# Patient Record
Sex: Male | Born: 2000 | Race: Black or African American | Hispanic: No | Marital: Single | Smoking: Never smoker
Health system: Southern US, Community
[De-identification: ages and names within clinical notes are randomized; demographics above are authoritative.]

## PROBLEM LIST (undated history)

## (undated) HISTORY — PX: TONSILLECTOMY: SUR1361

## (undated) HISTORY — PX: HERNIA REPAIR: SHX51

---
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
## 2019-06-18 ENCOUNTER — Other Ambulatory Visit: Payer: Self-pay

## 2019-06-18 DIAGNOSIS — Z20822 Contact with and (suspected) exposure to covid-19: Secondary | ICD-10-CM

## 2019-06-19 LAB — NOVEL CORONAVIRUS, NAA: SARS-CoV-2, NAA: DETECTED — AB

## 2019-06-29 DIAGNOSIS — Z20828 Contact with and (suspected) exposure to other viral communicable diseases: Secondary | ICD-10-CM | POA: Diagnosis not present

## 2019-07-05 ENCOUNTER — Other Ambulatory Visit: Payer: Self-pay

## 2019-07-05 DIAGNOSIS — Z20828 Contact with and (suspected) exposure to other viral communicable diseases: Secondary | ICD-10-CM | POA: Diagnosis not present

## 2019-07-05 DIAGNOSIS — Z20822 Contact with and (suspected) exposure to covid-19: Secondary | ICD-10-CM

## 2019-07-07 LAB — NOVEL CORONAVIRUS, NAA: SARS-CoV-2, NAA: NOT DETECTED

## 2019-07-29 ENCOUNTER — Other Ambulatory Visit: Payer: Self-pay

## 2019-07-29 DIAGNOSIS — Z20822 Contact with and (suspected) exposure to covid-19: Secondary | ICD-10-CM

## 2019-07-31 LAB — NOVEL CORONAVIRUS, NAA: SARS-CoV-2, NAA: NOT DETECTED

## 2019-08-07 ENCOUNTER — Emergency Department (HOSPITAL_COMMUNITY): Payer: Medicaid Other

## 2019-08-07 ENCOUNTER — Other Ambulatory Visit: Payer: Self-pay

## 2019-08-07 ENCOUNTER — Emergency Department (HOSPITAL_COMMUNITY)
Admission: EM | Admit: 2019-08-07 | Discharge: 2019-08-07 | Disposition: A | Discharge status: Home / Self Care | Payer: Medicaid Other | Attending: Emergency Medicine | Admitting: Emergency Medicine

## 2019-08-07 ENCOUNTER — Encounter (HOSPITAL_COMMUNITY): Payer: Self-pay | Admitting: Emergency Medicine

## 2019-08-07 DIAGNOSIS — M7989 Other specified soft tissue disorders: Secondary | ICD-10-CM | POA: Diagnosis not present

## 2019-08-07 DIAGNOSIS — Z79899 Other long term (current) drug therapy: Secondary | ICD-10-CM | POA: Insufficient documentation

## 2019-08-07 DIAGNOSIS — S99922A Unspecified injury of left foot, initial encounter: Secondary | ICD-10-CM | POA: Diagnosis not present

## 2019-08-07 DIAGNOSIS — M79672 Pain in left foot: Secondary | ICD-10-CM | POA: Insufficient documentation

## 2019-08-07 NOTE — ED Provider Notes (Signed)
[facility] DEPT Provider Note   CSN: [id number] Arrival date & time: 08/07/19  1948     History   Chief Complaint Chief Complaint  Patient presents with  . Foot Pain    HPI Luis May is a 18 y.o. male presents today for evaluation of acute onset, persistent left foot pain for 3 days.  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  He struck his left great toe and dorsum of the left foot on the cement step and has had throbbing pain since then.  Notes some mild swelling and ecchymosis to the dorsum of the left foot.  Denies numbness or tingling.  Pain worsens with attempts to weight-bear but he is able to do so without difficulty.  He took an over-the-counter pain reliever with little relief.  Denies fevers.     The history is provided by the patient.  Foot Pain    History reviewed. No pertinent past medical history.  There are no active problems to display for this patient.   Past Surgical History:  Procedure Laterality Date  . HERNIA REPAIR    . TONSILLECTOMY          Home Medications    Prior to Admission medications   Medication Sig Start Date End Date Taking? Authorizing Provider  cephALEXin (KEFLEX) 500 MG capsule Take 1 capsule (500 mg total) by mouth 3 (three) times daily. 06/18/15   Kirichenko, Tatyana, PA-C  naproxen (NAPROSYN) 500 MG tablet Take 1 tablet (500 mg total) by mouth 2 (two) times daily. 06/18/15   Jeannett Senior, PA-C    Family History No family history on file.  Social History Social History   Tobacco Use  . Smoking status: Not on file  Substance Use Topics  . Alcohol use: Not on file  . Drug use: Not on file     Allergies   Patient has no known allergies.   Review of Systems Review of Systems  Constitutional: Negative for chills and fever.  Musculoskeletal: Positive for arthralgias.  Neurological: Negative for weakness  and numbness.     Physical Exam Updated Vital Signs BP 128/85   Pulse 73   Temp 98.6 F (37 C)   Resp 14   Ht 5\' 10"  (1.778 m)   Wt 72.6 kg   SpO2 100%   BMI 22.96 kg/m   Physical Exam Vitals signs and nursing note reviewed.  Constitutional:      General: He is not in acute distress.    Appearance: He is well-developed.  HENT:     Head: Normocephalic and atraumatic.  Eyes:     General:        Right eye: No discharge.        Left eye: No discharge.     Conjunctiva/sclera: Conjunctivae normal.  Neck:     Vascular: No JVD.     Trachea: No tracheal deviation.  Cardiovascular:     Rate and Rhythm: Normal rate.     Pulses: Normal pulses.     Comments: 2+ DP/PT pulses bilaterally, compartments are soft Pulmonary:     Effort: Pulmonary effort is normal.  Abdominal:     General: There is no distension.  Musculoskeletal:        General: Tenderness present.     Comments: Mild ecchymosis to the dorsum of the left foot, tenderness to palpation overlying the left first and second metatarsals,  no crepitus or deformity.  5/5 strength of BLE major muscle groups including with plantarflexion, dorsiflexion, inversion, and eversion of the left ankle and EHL strength.  Examination of Achilles tendon is within normal limits.  Skin:    General: Skin is warm and dry.     Capillary Refill: Capillary refill takes less than 2 seconds.     Findings: No erythema.  Neurological:     Mental Status: He is alert.     Comments: Ambulatory with steady gait and balance, able to heel walk and toe walk without difficulty.  Sensation intact to light touch of bilateral lower extremities  Psychiatric:        Behavior: Behavior normal.      ED Treatments / Results  Labs (all labs ordered are listed, but only abnormal results are displayed) Labs Reviewed - No data to display  EKG None  Radiology Dg Foot Complete Left  Result Date: 08/07/2019 CLINICAL DATA:  Stubbing toe injury of the great  toe 3 days ago. Pain and swelling. EXAM: LEFT FOOT - COMPLETE 3+ VIEW COMPARISON:  Left ankle radiographs from 01/10/2010 FINDINGS: There is no evidence of fracture or dislocation. There is no evidence of arthropathy or other focal bone abnormality. Soft tissues are unremarkable. IMPRESSION: Negative. Electronically Signed   By: Van Clines M.D.   On: 08/07/2019 20:15    Procedures Procedures (including critical care time)  Medications Ordered in ED Medications - No data to display   Initial Impression / Assessment and Plan / ED Course  I have reviewed the triage vital signs and the nursing notes.  Pertinent labs & imaging results that were available during my care of the patient were reviewed by me and considered in my medical decision making (see chart for details).        Patient presenting for evaluation of left foot pain secondary to injury 3 days ago.  He is afebrile, vital signs are stable.  He is nontoxic in appearance.  He is neurovascularly intact, ambulatory without difficulty in the ED.  Compartments are soft.  Radiographs are negative for acute osseous abnormality.  No signs of secondary skin infection.  Conservative therapy and management indicated and discussed with patient.  Encouraged follow-up with PCP or orthopedist if symptoms persist, also discussed possibility of missed fracture diagnosis.  Discussed strict ED return precautions.  Patient and significant other verbalized understanding of and agreement with plan and patient is safe for discharge home at this time.   Final Clinical Impressions(s) / ED Diagnoses   Final diagnoses:  Acute foot pain, left    ED Discharge Orders    None       Renita Papa, PA-C 08/07/19 2200    Lucrezia Starch, MD 08/08/19 1513

## 2019-08-07 NOTE — Discharge Instructions (Signed)
1. Medications: Alternate 600 mg of ibuprofen and [phone] mg of Tylenol every 3 hours as needed for pain. Do not exceed 4000 mg of Tylenol daily.  Take ibuprofen with food to avoid upset stomach issues.  2. Treatment: rest, ice, elevate and use brace, drink plenty of fluids, gentle stretching 3. Follow Up: Please followup with orthopedics as directed or your PCP in 1 week if no improvement for discussion of your diagnoses and further evaluation after today's visit; if you do not have a primary care doctor use the resource guide provided to find one; Please return to the ER for worsening symptoms or other concerns such as worsening swelling, redness of the skin, fevers, loss of pulses, or loss of feeling

## 2019-08-07 NOTE — ED Notes (Signed)
Pt refused discharge vital signs

## 2019-08-07 NOTE — ED Triage Notes (Signed)
Patient reports hitting left great toe on cement step x3 days ago. Reports pain to foot with swelling. Ambulatory.

## 2019-08-12 ENCOUNTER — Other Ambulatory Visit: Payer: Self-pay

## 2019-08-12 DIAGNOSIS — Z20828 Contact with and (suspected) exposure to other viral communicable diseases: Secondary | ICD-10-CM | POA: Diagnosis not present

## 2019-08-12 DIAGNOSIS — Z20822 Contact with and (suspected) exposure to covid-19: Secondary | ICD-10-CM

## 2019-08-13 LAB — NOVEL CORONAVIRUS, NAA: SARS-CoV-2, NAA: NOT DETECTED

## 2019-09-30 DIAGNOSIS — Z00129 Encounter for routine child health examination without abnormal findings: Secondary | ICD-10-CM | POA: Diagnosis not present

## 2019-09-30 DIAGNOSIS — Z68.41 Body mass index (BMI) pediatric, 5th percentile to less than 85th percentile for age: Secondary | ICD-10-CM | POA: Diagnosis not present

## 2019-09-30 DIAGNOSIS — Z713 Dietary counseling and surveillance: Secondary | ICD-10-CM | POA: Diagnosis not present

## 2019-09-30 DIAGNOSIS — Z0000 Encounter for general adult medical examination without abnormal findings: Secondary | ICD-10-CM | POA: Diagnosis not present

## 2019-09-30 DIAGNOSIS — Z7182 Exercise counseling: Secondary | ICD-10-CM | POA: Diagnosis not present

## 2020-02-27 ENCOUNTER — Other Ambulatory Visit: Payer: Self-pay

## 2020-02-27 ENCOUNTER — Ambulatory Visit (INDEPENDENT_AMBULATORY_CARE_PROVIDER_SITE_OTHER): Payer: Medicaid Other | Admitting: Dermatology

## 2020-02-27 DIAGNOSIS — L700 Acne vulgaris: Secondary | ICD-10-CM

## 2020-02-27 NOTE — Progress Notes (Signed)
   Follow-Up Visit   Subjective  Luis May is a 19 y.o. male who presents for the following: Acne.  Patient here today for 3 month acne follow up. He is using BenzaClin in the am and adapalene 0.3% gel at bedtime. Acne is improving per patient. Patient is not having any issues on the topical medications.   The following portions of the chart were reviewed this encounter and updated as appropriate:  Allergies  Meds  Problems  Med Hx  Surg Hx  Fam Hx      Review of Systems:  No other skin or systemic complaints except as noted in HPI or Assessment and Plan.  Objective  Well appearing patient in no apparent distress; mood and affect are within normal limits.  A focused examination was performed including face. Relevant physical exam findings are noted in the Assessment and Plan.  Objective  face: Mild to moderate open non-inflamed comedones on temples and forehead   Assessment & Plan    Acne vulgaris face  Cont adapalene 0.3% gel increasing to QHS to temples and forehead. May use every other night to rest of the face. Cont BenzaClin every other morning  Return in about 6 months (around 08/28/2020) for Acne.  Graciella Belton, RMA, am acting as scribe for Sarina Ser, MD .  Documentation: I have reviewed the above documentation for accuracy and completeness, and I agree with the above.  Sarina Ser, MD

## 2020-02-27 NOTE — Patient Instructions (Addendum)
Continue adapalene 0.3% gel increasing to every night to temples and forehead. May use every other night to rest of the face. Continue BenzaClin every other morning.

## 2020-03-02 ENCOUNTER — Encounter: Payer: Self-pay | Admitting: Dermatology

## 2020-07-02 ENCOUNTER — Other Ambulatory Visit: Payer: Self-pay

## 2020-07-02 ENCOUNTER — Ambulatory Visit (INDEPENDENT_AMBULATORY_CARE_PROVIDER_SITE_OTHER): Payer: 59 | Admitting: Dermatology

## 2020-07-02 DIAGNOSIS — L910 Hypertrophic scar: Secondary | ICD-10-CM | POA: Diagnosis not present

## 2020-07-02 DIAGNOSIS — D485 Neoplasm of uncertain behavior of skin: Secondary | ICD-10-CM | POA: Diagnosis not present

## 2020-07-02 DIAGNOSIS — L700 Acne vulgaris: Secondary | ICD-10-CM

## 2020-07-02 MED ORDER — CLINDAMYCIN PHOS-BENZOYL PEROX 1-5 % EX GEL: CUTANEOUS | 6 refills | Status: DC

## 2020-07-02 MED ORDER — ADAPALENE 0.3 % EX GEL: CUTANEOUS | 6 refills | Status: DC

## 2020-07-02 NOTE — Patient Instructions (Addendum)
Topical retinoid medications like tretinoin/Retin-A, adapalene/Differin, tazarotene/Fabior, and Epiduo/Epiduo Forte can cause dryness and irritation when first started. Only apply a pea-sized amount to the entire affected area. Avoid applying it around the eyes, edges of mouth and creases at the nose. If you experience irritation, use a good moisturizer first and/or apply the medicine less often. If you are doing well with the medicine, you can increase how often you use it until you are applying every night. Be careful with sun protection while using this medication as it can make you sensitive to the sun. This medicine should not be used by pregnant women.   Wound Care Instructions  1. Cleanse wound gently with soap and water once a day then pat dry with clean gauze. Apply a thing coat of Petrolatum (petroleum jelly, "Vaseline") over the wound (unless you have an allergy to this). We recommend that you use a new, sterile tube of Vaseline. Do not pick or remove scabs. Do not remove the yellow or white "healing tissue" from the base of the wound.  2. Cover the wound with fresh, clean, nonstick gauze and secure with paper tape. You may use Band-Aids in place of gauze and tape if the would is small enough, but would recommend trimming much of the tape off as there is often too much. Sometimes Band-Aids can irritate the skin.  3. You should call the office for your biopsy report after 1 week if you have not already been contacted.  4. If you experience any problems, such as abnormal amounts of bleeding, swelling, significant bruising, significant pain, or evidence of infection, please call the office immediately.  5. FOR ADULT SURGERY PATIENTS: If you need something for pain relief you may take 1 extra strength Tylenol (acetaminophen) AND 2 Ibuprofen (200mg  each) together every 4 hours as needed for pain. (do not take these if you are allergic to them or if you have a reason you should not take them.)  Typically, you may only need pain medication for 1 to 3 days.

## 2020-07-02 NOTE — Progress Notes (Signed)
   Follow-Up Visit   Subjective  Luis May is a 19 y.o. male who presents for the following: Acne. Patient is currently using Adapalene gel every other night. He lost the Benzaclin gel while moving. He also has a lesion on the L ear that we had discussed biopsying in the past.  We will do that today.  The following portions of the chart were reviewed this encounter and updated as appropriate:  Allergies  Meds  Problems  Med Hx  Surg Hx  Fam Hx     Review of Systems:  No other skin or systemic complaints except as noted in HPI or Assessment and Plan.  Objective  Well appearing patient in no apparent distress; mood and affect are within normal limits.  A focused examination was performed including the face . Relevant physical exam findings are noted in the Assessment and Plan.  Objective  Face: Moderate fine closed comedones over the face   Objective  Left Ear: Total size measures 0.7 cm - verrucous papule  Biopsy site measures 0.4 cm - verrucous papule      Assessment & Plan  Acne vulgaris Face  Increase Adapalene 0.3% gel to QHS instead of QOD. Restart Benzaclin gel QAM. Topical retinoid medications like tretinoin/Retin-A, adapalene/Differin, tazarotene/Fabior, and Epiduo/Epiduo Forte can cause dryness and irritation when first started. Only apply a pea-sized amount to the entire affected area. Avoid applying it around the eyes, edges of mouth and creases at the nose. If you experience irritation, use a good moisturizer first and/or apply the medicine less often. If you are doing well with the medicine, you can increase how often you use it until you are applying every night. Be careful with sun protection while using this medication as it can make you sensitive to the sun. This medicine should not be used by pregnant women.    clindamycin-benzoyl peroxide (BENZACLIN WITH PUMP) gel - Face  Adapalene (DIFFERIN) 0.3 % gel - Face   Neoplasm of uncertain behavior of  skin -keloid versus seborrheic keratosis versus nevus versus viral wart versus other Left Ear  Skin / nail biopsy Type of biopsy: tangential   Informed consent: discussed and consent obtained   Timeout: patient name, date of birth, surgical site, and procedure verified   Procedure prep:  Patient was prepped and draped in usual sterile fashion Prep type:  Isopropyl alcohol Anesthesia: the lesion was anesthetized in a standard fashion   Anesthetic:  1% lidocaine w/ epinephrine 1-100,000 buffered w/ 8.4% NaHCO3 Instrument used: flexible razor blade   Hemostasis achieved with: pressure, aluminum chloride and electrodesiccation   Outcome: patient tolerated procedure well   Post-procedure details: sterile dressing applied and wound care instructions given   Dressing type: bandage and petrolatum    Specimen 1 - Surgical pathology Differential Diagnosis: D48.5 wart vs keloid vs other  Check Margins: No Total size measures 0.7 cm - verrucous papule  Biopsy site measures 0.4 cm - verrucous papule  Return in about 6 months (around 12/31/2020) for F/U appt. - recheck acne .  Luther Redo, CMA, am acting as scribe for Sarina Ser, MD .  Documentation: I have reviewed the above documentation for accuracy and completeness, and I agree with the above.  Sarina Ser, MD

## 2020-07-03 ENCOUNTER — Encounter: Payer: Self-pay | Admitting: Dermatology

## 2020-07-08 ENCOUNTER — Telehealth: Payer: Self-pay

## 2020-07-08 NOTE — Telephone Encounter (Signed)
Left message on voicemail to return my call.  

## 2020-07-08 NOTE — Telephone Encounter (Signed)
-----   Message from Ralene Bathe, MD sent at 07/07/2020  6:56 PM EDT ----- Skin , left ear VERRUCA VULGARIS, IRRITATED  Benign viral wart Schedule for treatment of remaining spot

## 2020-07-13 NOTE — Telephone Encounter (Signed)
Pt informed of results. Will call back to schedule follow up.

## 2020-11-01 ENCOUNTER — Encounter (HOSPITAL_COMMUNITY): Payer: Self-pay | Admitting: Emergency Medicine

## 2020-11-01 ENCOUNTER — Emergency Department (HOSPITAL_COMMUNITY)
Admission: EM | Admit: 2020-11-01 | Discharge: 2020-11-02 | Disposition: A | Discharge status: Home / Self Care | Payer: Medicaid Other | Attending: Emergency Medicine | Admitting: Emergency Medicine

## 2020-11-01 ENCOUNTER — Other Ambulatory Visit: Payer: Self-pay

## 2020-11-01 DIAGNOSIS — T7840XA Allergy, unspecified, initial encounter: Secondary | ICD-10-CM | POA: Diagnosis not present

## 2020-11-01 MED ORDER — DIPHENHYDRAMINE HCL 50 MG/ML IJ SOLN
25.0000 mg | Freq: Once | INTRAMUSCULAR | Status: AC
  Administered 2020-11-01: 25 mg via INTRAVENOUS
  Filled 2020-11-01: qty 1

## 2020-11-01 MED ORDER — METHYLPREDNISOLONE SODIUM SUCC 125 MG IJ SOLR
125.0000 mg | Freq: Once | INTRAMUSCULAR | Status: AC
  Administered 2020-11-01: 125 mg via INTRAVENOUS
  Filled 2020-11-01: qty 2

## 2020-11-01 MED ORDER — FAMOTIDINE IN NACL 20-0.9 MG/50ML-% IV SOLN
20.0000 mg | Freq: Once | INTRAVENOUS | Status: AC
  Administered 2020-11-01: 20 mg via INTRAVENOUS
  Filled 2020-11-01: qty 50

## 2020-11-01 NOTE — ED Provider Notes (Signed)
[facility] DEPT Provider Note   CSN: [id number] Arrival date & time: 11/01/20  2258     History Chief Complaint  Patient presents with  . Allergic Reaction    NIV Luis May is a 20 y.o. male.  The history is provided by the patient.  Allergic Reaction Presenting symptoms: no rash and no wheezing   Severity:  Moderate Prior allergic episodes:  Food/nut allergies Context: food   Context comment:  Strawberries  Relieved by:  Nothing Worsened by:  Nothing Ineffective treatments:  None tried Knew he was allergic to strawberries but ate candy with label in another language and ate it.  No swelling but felt like lips might swell.      History reviewed. No pertinent past medical history.  There are no problems to display for this patient.   Past Surgical History:  Procedure Laterality Date  . HERNIA REPAIR    . TONSILLECTOMY         History reviewed. No pertinent family history.  Social History   Tobacco Use  . Smoking status: Never Smoker  . Smokeless tobacco: Never Used  Substance Use Topics  . Alcohol use: Never  . Drug use: Never    Home Medications Prior to Admission medications   Medication Sig Start Date End Date Taking? Authorizing Provider  Adapalene (DIFFERIN) 0.3 % gel Apply a pea sized amount to the entire face QHS. 07/02/20   Ralene Bathe, MD  Adapalene 0.3 % gel APPLY A SMALL AMOUNT TO AFFECTED AREA EVERY NIGHT [place] OFF IN AM Patient not taking: Reported on 07/02/2020 12/29/19   [provider]  cephALEXin (KEFLEX) 500 MG capsule Take 1 capsule (500 mg total) by mouth 3 (three) times daily. Patient not taking: Reported on 02/27/2020 06/18/15   Jeannett Senior, PA-C  clindamycin-benzoyl peroxide (BENZACLIN WITH PUMP) gel Apply a thin coat to the entire face QAM. 07/02/20   Ralene Bathe, MD  clindamycin-benzoyl peroxide [facility]) gel SMARTSIG:1 [place] Topical Every Morning Patient not  taking: Reported on 07/02/2020 11/27/19   [provider]  naproxen (NAPROSYN) 500 MG tablet Take 1 tablet (500 mg total) by mouth 2 (two) times daily. Patient not taking: Reported on 02/27/2020 06/18/15   Jeannett Senior, PA-C    Allergies    Strawberry (diagnostic)  Review of Systems   Review of Systems  Constitutional: Negative for fever.  HENT: Negative for congestion.   Eyes: Negative for visual disturbance.  Respiratory: Negative for wheezing.   Gastrointestinal: Negative for abdominal pain.  Genitourinary: Negative for difficulty urinating.  Musculoskeletal: Negative for arthralgias.  Skin: Negative for rash.  Neurological: Negative for dizziness.  Psychiatric/Behavioral: Negative for agitation.  All other systems reviewed and are negative.   Physical Exam Updated Vital Signs BP 140/88 (BP Location: Right Arm)   Pulse 78   Temp 98.4 F (36.9 C) (Oral)   Resp 18   Ht 5\' 11"  (1.803 m)   Wt 72.6 kg   SpO2 98%   BMI 22.32 kg/m   Physical Exam Vitals and nursing note reviewed.  Constitutional:      Appearance: Normal appearance. He is not diaphoretic.  HENT:     Head: Normocephalic and atraumatic.     Nose: Nose normal.     Mouth/Throat:     Mouth: Mucous membranes are moist.     Pharynx: Oropharynx is clear.     Comments: No swelling of the lips tongue or uvula  Eyes:     Pupils:  Pupils are equal, round, and reactive to light.  Cardiovascular:     Rate and Rhythm: Normal rate and regular rhythm.     Pulses: Normal pulses.     Heart sounds: Normal heart sounds.  Pulmonary:     Effort: Pulmonary effort is normal.     Breath sounds: Normal breath sounds.  Abdominal:     General: Abdomen is flat. Bowel sounds are normal.     Palpations: Abdomen is soft.     Tenderness: There is no abdominal tenderness. There is no guarding.  Musculoskeletal:        General: Normal range of motion.     Cervical back: Normal range of motion and neck supple.   Skin:    General: Skin is warm and dry.     Capillary Refill: Capillary refill takes less than 2 seconds.     Findings: No rash.  Neurological:     General: No focal deficit present.     Mental Status: He is alert and oriented to person, place, and time.     Deep Tendon Reflexes: Reflexes normal.  Psychiatric:        Mood and Affect: Mood normal.        Behavior: Behavior normal.     ED Results / Procedures / Treatments   Labs (all labs ordered are listed, but only abnormal results are displayed) Labs Reviewed - No data to display  EKG None  Radiology No results found.  Procedures Procedures   Medications Ordered in ED Medications  methylPREDNISolone sodium succinate (SOLU-MEDROL) 125 mg/2 mL injection 125 mg (has no administration in time range)  famotidine (PEPCID) IVPB 20 mg premix (has no administration in time range)  diphenhydrAMINE (BENADRYL) injection 25 mg (25 mg Intravenous Given 11/01/20 2319)    ED Course  I have reviewed the triage vital signs and the nursing notes.  Pertinent labs & imaging results that were available during my care of the patient were reviewed by me and considered in my medical decision making (see chart for details).   no signs of aResting comfortably post meds.  Do not ingest strawberries in any form.  Stable for discharge with close follow up.   DEMONTRAY FRANTA was evaluated in Emergency Department on 11/01/2020 for the symptoms described in the history of present illness. He was evaluated in the context of the global COVID-19 pandemic, which necessitated consideration that the patient might be at risk for infection with the SARS-CoV-2 virus that causes COVID-19. Institutional protocols and algorithms that pertain to the evaluation of patients at risk for COVID-19 are in a state of rapid change based on information released by regulatory bodies including the CDC and federal and state organizations. These policies and algorithms were  followed during the patient's care in the ED.  Final Clinical Impression(s) / ED Diagnoses Return for intractable cough, coughing up blood, fevers >100.4 unrelieved by medication, shortness of breath, intractable vomiting, chest pain, shortness of breath, weakness, numbness, changes in speech, facial asymmetry, abdominal pain, passing out, Inability to tolerate liquids or food, cough, altered mental status or any concerns. No signs of systemic illness or infection. The patient is nontoxic-appearing on exam and vital signs are within normal limits.  I have reviewed the triage vital signs and the nursing notes. Pertinent labs & imaging results that were available during my care of the patient were reviewed by me and considered in my medical decision making (see chart for details). After history, exam, and medical workup I  feel the patient has been appropriately medically screened and is safe for discharge home. Pertinent diagnoses were discussed with the patient. Patient was given return precautions.    Ellinor Test, MD 11/02/20 4799

## 2020-11-01 NOTE — ED Triage Notes (Signed)
Pt sts allergy to strawberries. Ate strawberries 1 hr prior to arrival. Lip swelling.

## 2020-11-02 MED ORDER — PREDNISONE 20 MG PO TABS: ORAL_TABLET | ORAL | 0 refills | Status: AC

## 2020-12-17 ENCOUNTER — Ambulatory Visit: Payer: Medicaid Other | Admitting: Family Medicine

## 2021-01-05 DIAGNOSIS — U071 COVID-19: Secondary | ICD-10-CM | POA: Diagnosis not present

## 2021-01-14 ENCOUNTER — Ambulatory Visit: Payer: 59 | Admitting: Dermatology

## 2021-05-27 ENCOUNTER — Ambulatory Visit: Payer: Self-pay | Admitting: Dermatology

## 2021-05-27 ENCOUNTER — Other Ambulatory Visit: Payer: Self-pay

## 2021-06-20 IMAGING — CR DG FOOT COMPLETE 3+V*L*
3 series · 3 of 3 positions shown · non-contrast
Comparison: Left ankle radiographs from 01/10/2010

CLINICAL DATA: Stubbing toe injury of the great toe 3 days ago.
Pain and swelling.

EXAM:
LEFT FOOT - COMPLETE 3+ VIEW

[x foot ap left]
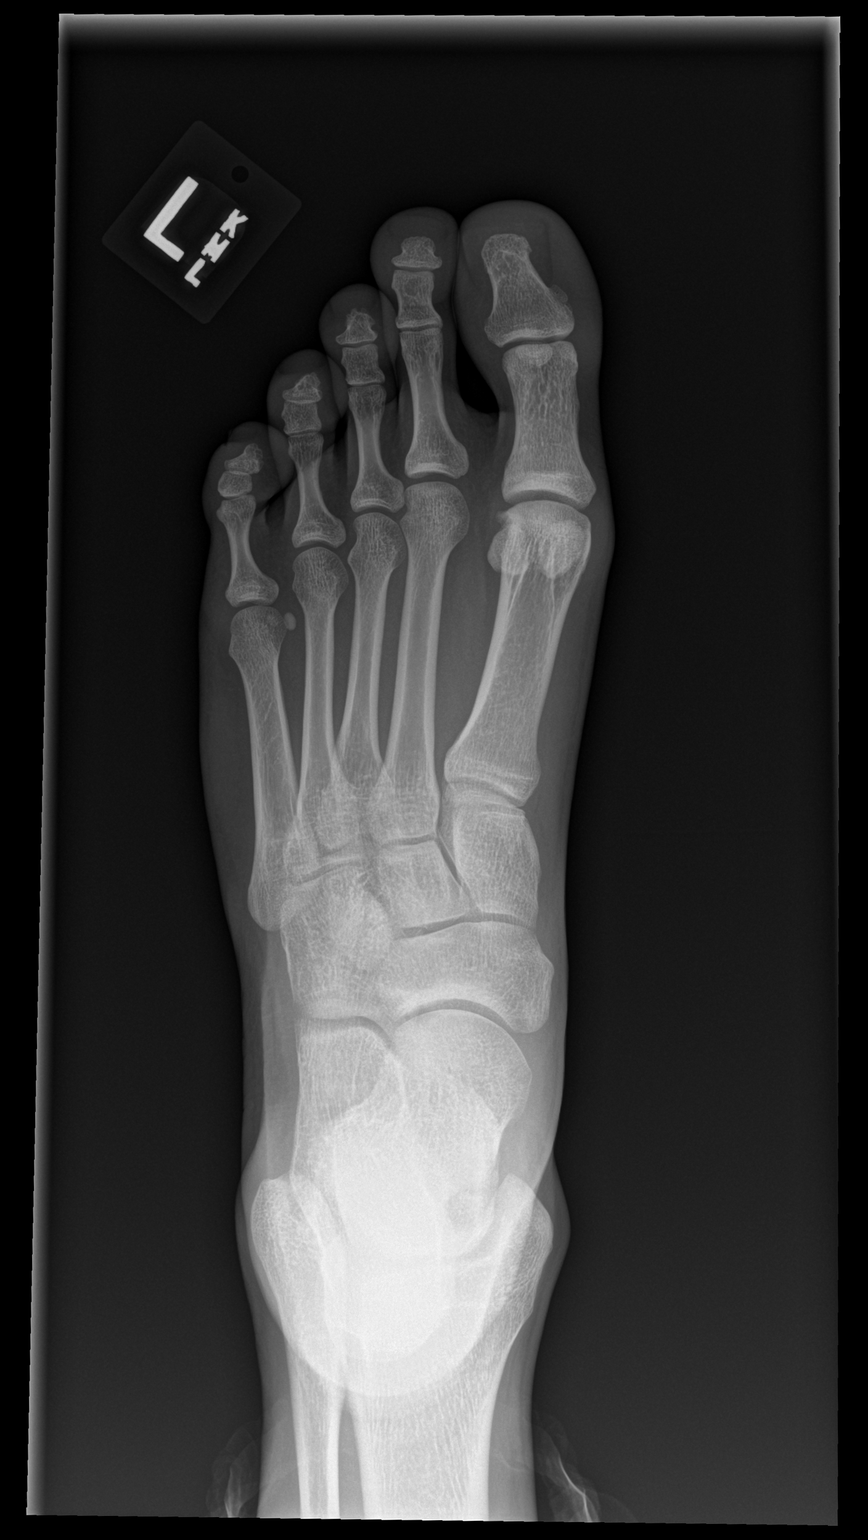

[x foot obl left]
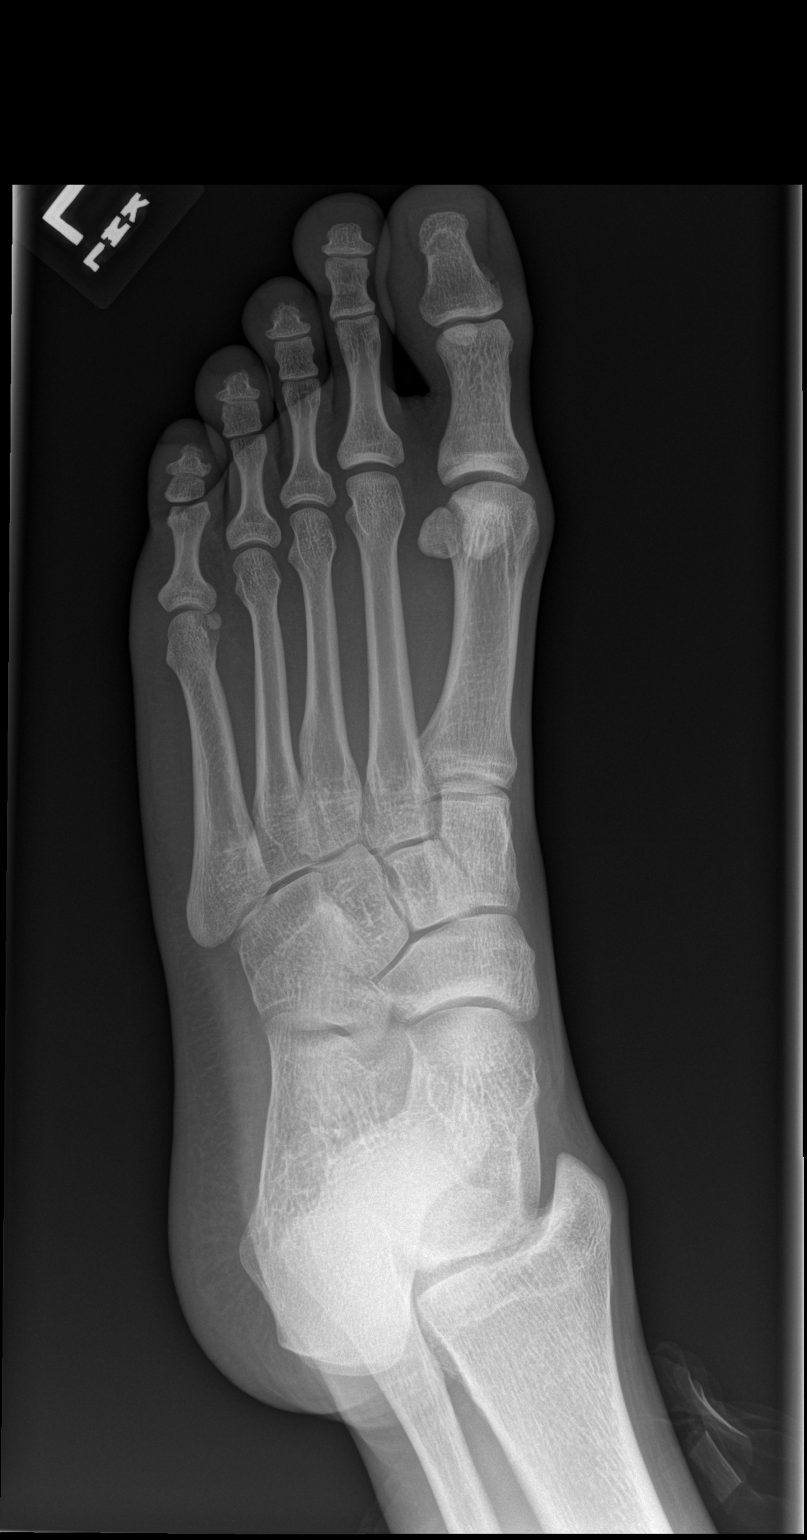

[x foot lat left]
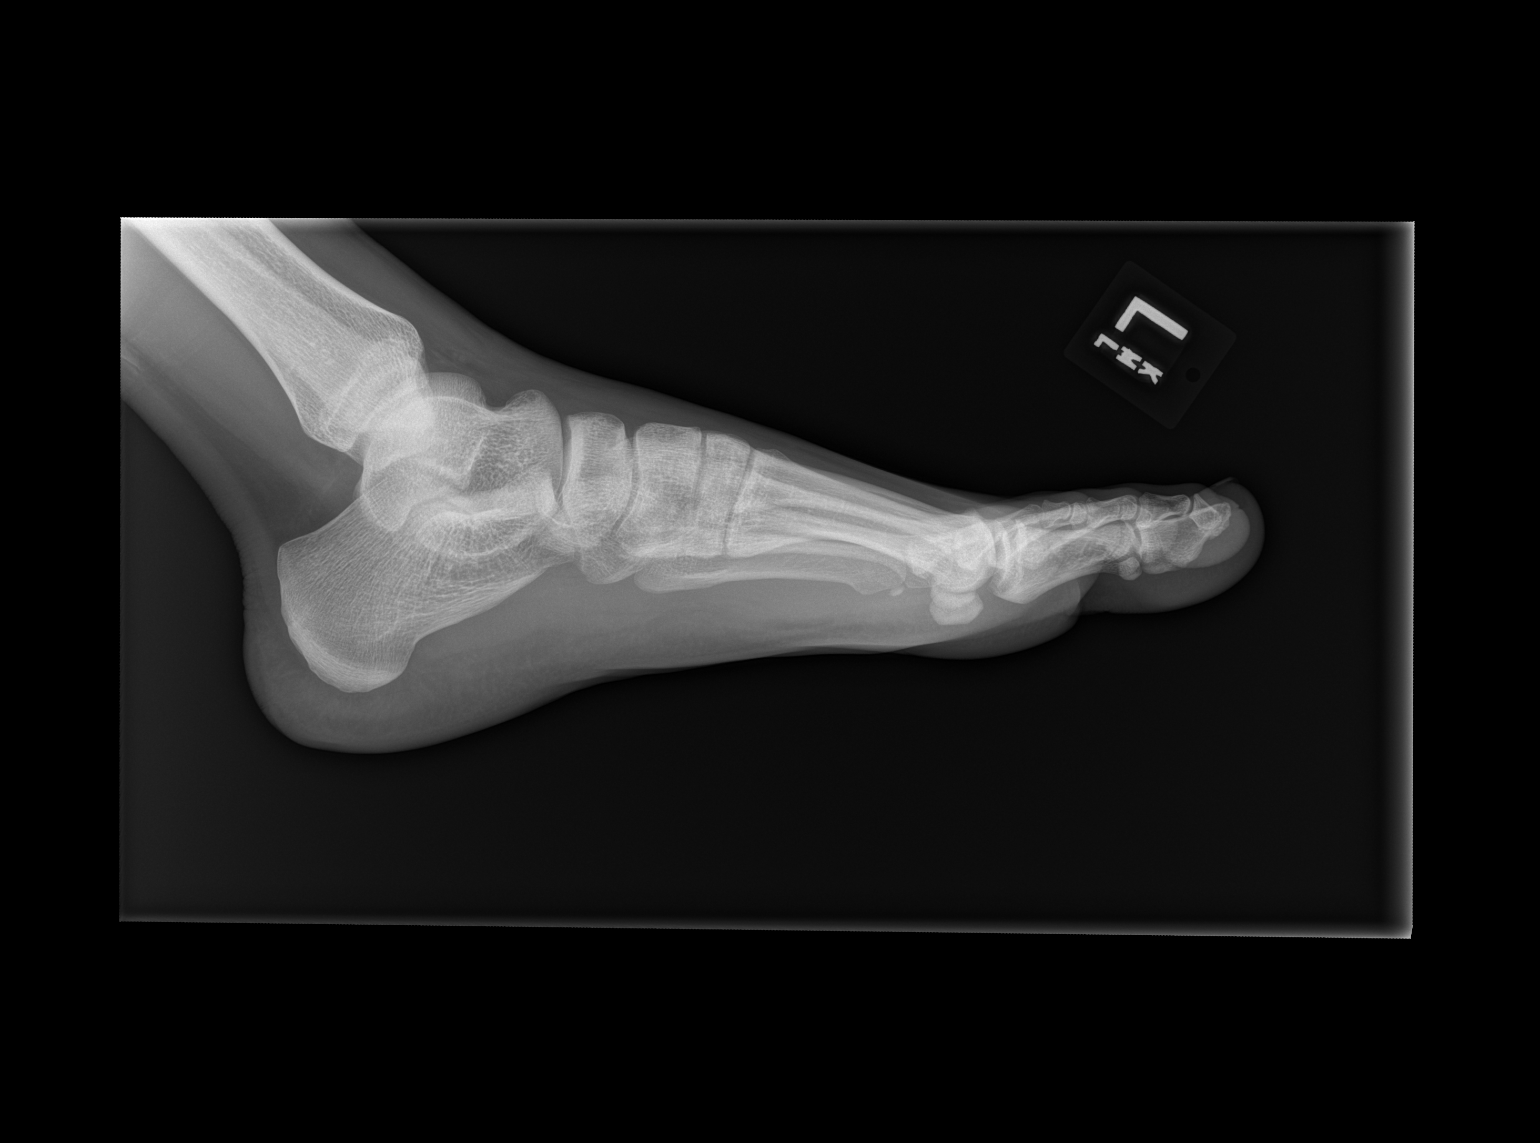

[3 of 3 positions shown; findings below may reference images not displayed]

FINDINGS: There is no evidence of fracture or dislocation. There is no
evidence of arthropathy or other focal bone abnormality. Soft
tissues are unremarkable.
IMPRESSION: Negative.

## 2021-06-24 ENCOUNTER — Ambulatory Visit: Payer: Medicaid Other | Admitting: Dermatology

## 2021-07-01 ENCOUNTER — Ambulatory Visit: Payer: Medicaid Other | Admitting: Dermatology

## 2021-12-27 ENCOUNTER — Ambulatory Visit: Payer: Self-pay | Admitting: Dermatology

## 2022-04-14 ENCOUNTER — Ambulatory Visit: Payer: Medicaid Other | Admitting: Dermatology

## 2022-06-14 ENCOUNTER — Ambulatory Visit: Payer: Medicaid Other | Admitting: Dermatology

## 2022-07-14 ENCOUNTER — Ambulatory Visit (INDEPENDENT_AMBULATORY_CARE_PROVIDER_SITE_OTHER): Payer: Commercial Managed Care - HMO | Admitting: Dermatology

## 2022-07-14 DIAGNOSIS — Z79899 Other long term (current) drug therapy: Secondary | ICD-10-CM

## 2022-07-14 DIAGNOSIS — L700 Acne vulgaris: Secondary | ICD-10-CM | POA: Diagnosis not present

## 2022-07-14 MED ORDER — ADAPALENE 0.3 % EX GEL: CUTANEOUS | 11 refills | Status: AC

## 2022-07-14 MED ORDER — CLINDAMYCIN PHOS-BENZOYL PEROX 1-5 % EX GEL: CUTANEOUS | 11 refills | Status: AC

## 2022-07-14 NOTE — Progress Notes (Signed)
   Follow-Up Visit   Subjective  Luis May is a 21 y.o. male who presents for the following: Acne (Differin and Benzaclin in the past - not currently using anything).  The following portions of the chart were reviewed this encounter and updated as appropriate:   Tobacco  Allergies  Meds  Problems  Med Hx  Surg Hx  Fam Hx     Review of Systems:  No other skin or systemic complaints except as noted in HPI or Assessment and Plan.  Objective  Well appearing patient in no apparent distress; mood and affect are within normal limits.  A focused examination was performed including face. Relevant physical exam findings are noted in the Assessment and Plan.  Face Fine comedones of forehead and medial cheeks   Assessment & Plan  Acne vulgaris Face Chronic and persistent condition with duration or expected duration over one year. Condition is symptomatic / bothersome to patient. Not to goal.  Restart Benzaclin qam, Differin 0.3% qhs  Related Medications clindamycin-benzoyl peroxide (BENZACLIN WITH PUMP) gel Apply a thin coat to the entire face QAM.  Adapalene (DIFFERIN) 0.3 % gel Apply a pea sized amount to the entire face QHS.  Return in about 1 year (around 07/15/2023).  I, Ashok Cordia, CMA, am acting as scribe for Sarina Ser, MD . Documentation: I have reviewed the above documentation for accuracy and completeness, and I agree with the above.  Sarina Ser, MD

## 2022-07-14 NOTE — Patient Instructions (Signed)
Topical retinoid medications like tretinoin/Retin-A, adapalene/Differin, tazarotene/Fabior, and Epiduo/Epiduo Forte can cause dryness and irritation when first started. Only apply a pea-sized amount to the entire affected area. Avoid applying it around the eyes, edges of mouth and creases at the nose. If you experience irritation, use a good moisturizer first and/or apply the medicine less often. If you are doing well with the medicine, you can increase how often you use it until you are applying every night. Be careful with sun protection while using this medication as it can make you sensitive to the sun. This medicine should not be used by pregnant women.    Due to recent changes in healthcare laws, you may see results of your pathology and/or laboratory studies on MyChart before the doctors have had a chance to review them. We understand that in some cases there may be results that are confusing or concerning to you. Please understand that not all results are received at the same time and often the doctors may need to interpret multiple results in order to provide you with the best plan of care or course of treatment. Therefore, we ask that you please give [place] 2 business days to thoroughly review all your results before contacting the office for clarification. Should we see a critical lab result, you will be contacted sooner.   If You Need Anything After Your Visit  If you have any questions or concerns for your doctor, please call our main line at [phone] and press option 4 to reach your doctor's medical assistant. If no one answers, please leave a voicemail as directed and we will return your call as soon as possible. Messages left after 4 pm will be answered the following business day.   You may also send [place] a message via [place]. We typically respond to MyChart messages within 1-2 business days.  For prescription refills, please ask your pharmacy to contact our office. Our fax number is  [phone].  If you have an urgent issue when the clinic is closed that cannot wait until the next business day, you can page your doctor at the number below.    Please note that while we do our best to be available for urgent issues outside of office hours, we are not available 24/7.   If you have an urgent issue and are unable to reach [place], you may choose to seek medical care at your doctor's office, retail clinic, urgent care center, or emergency room.  If you have a medical emergency, please immediately call 911 or go to the emergency department.  Pager Numbers  - Dr. Nehemiah Massed: [phone]  - Dr. Laurence Ferrari: [phone]  - Dr. Nicole Kindred: [phone]  In the event of inclement weather, please call our main line at [phone] for an update on the status of any delays or closures.  Dermatology Medication Tips: Please keep the boxes that topical medications come in in order to help keep track of the instructions about where and how to use these. Pharmacies typically print the medication instructions only on the boxes and not directly on the medication tubes.   If your medication is too expensive, please contact our office at [phone] option 4 or send [place] a message through [place].   We are unable to tell what your co-pay for medications will be in advance as this is different depending on your insurance coverage. However, we may be able to find a substitute medication at lower cost or fill out paperwork to get insurance to cover a  needed medication.   If a prior authorization is required to get your medication covered by your insurance company, please allow [place] 1-2 business days to complete this process.  Drug prices often vary depending on where the prescription is filled and some pharmacies may offer cheaper prices.  The website www.goodrx.com contains coupons for medications through different pharmacies. The prices here do not account for what the cost may be with help from  insurance (it may be cheaper with your insurance), but the website can give you the price if you did not use any insurance.  - You can print the associated coupon and take it with your prescription to the pharmacy.  - You may also stop by our office during regular business hours and pick up a GoodRx coupon card.  - If you need your prescription sent electronically to a different pharmacy, notify our office through [facility] or by phone at [phone] option 4.     Si Usted Necesita Algo Despus de Su Visita  Tambin puede enviarnos un mensaje a travs de Pharmacist, community. Por lo general respondemos a los mensajes de MyChart en el transcurso de 1 a 2 das hbiles.  Para renovar recetas, por favor pida a su farmacia que se ponga en contacto con nuestra oficina. Harland Dingwall de fax es [place] [phone].  Si tiene un asunto urgente cuando la clnica est cerrada y que no puede esperar hasta el siguiente da hbil, puede llamar/localizar a su doctor(a) al nmero que aparece a continuacin.   Por favor, tenga en cuenta que aunque hacemos todo lo posible para estar disponibles para asuntos urgentes fuera del horario de [place], no estamos disponibles las 24 horas del da, los 7 das de la [place].   Si tiene un problema urgente y no puede comunicarse con nosotros, puede optar por buscar atencin mdica  en el consultorio de su doctor(a), en una clnica privada, en un centro de atencin urgente o en una sala de emergencias.  Si tiene Engineering geologist, por favor llame inmediatamente al 911 o vaya a la sala de emergencias.  Nmeros de bper  - Dr. Nehemiah Massed: [phone]  - Dra. Moye: [phone]  - Dra. Nicole Kindred: [phone]  En caso de inclemencias del [place], por favor llame a Johnsie Kindred principal al [phone] para una actualizacin sobre el [place] de cualquier retraso o cierre.  Consejos para la medicacin en dermatologa: Por favor, guarde las cajas en las que vienen los  medicamentos de uso tpico para ayudarle a seguir las instrucciones sobre dnde y cmo usarlos. Las farmacias generalmente imprimen las instrucciones del medicamento slo en las cajas y no directamente en los tubos del [place].   Si su medicamento es muy caro, por favor, pngase en contacto con Zigmund Daniel llamando al [phone] y presione la opcin 4 o envenos un mensaje a travs de Pharmacist, community.   No podemos decirle cul ser su copago por los medicamentos por adelantado ya que esto es diferente dependiendo de la cobertura de su seguro. Sin embargo, es posible que podamos encontrar un medicamento sustituto a Electrical engineer un formulario para que el seguro cubra el medicamento que se considera necesario.   Si se requiere una autorizacin previa para que su compaa de seguros [place] su medicamento, por favor permtanos de 1 a 2 das hbiles para completar este proceso.  Los precios de los medicamentos varan con frecuencia dependiendo del Environmental consultant de dnde se surte la receta y alguna farmacias pueden ofrecer precios ms baratos.  El sitio web www.goodrx.com tiene cupones para medicamentos de diferentes farmacias. Los precios aqu no tienen en cuenta lo que podra costar con la ayuda del seguro (puede ser ms barato con su seguro), pero el sitio web puede darle el precio si no utiliz ningn seguro.  - Puede imprimir el cupn correspondiente y llevarlo con su receta a la farmacia.  - Tambin puede pasar por nuestra oficina durante el horario de atencin regular y recoger una tarjeta de cupones de GoodRx.  - Si necesita que su receta se enve electrnicamente a una farmacia diferente, informe a nuestra oficina a travs de MyChart de [place] o por telfono llamando al [phone] y presione la opcin 4.  

## 2022-07-18 ENCOUNTER — Encounter: Payer: Self-pay | Admitting: Dermatology

## 2022-07-20 ENCOUNTER — Ambulatory Visit: Payer: Commercial Managed Care - HMO | Attending: Audiologist | Admitting: Audiologist

## 2022-07-20 DIAGNOSIS — H9193 Unspecified hearing loss, bilateral: Secondary | ICD-10-CM | POA: Diagnosis not present

## 2022-07-20 DIAGNOSIS — Z0110 Encounter for examination of ears and hearing without abnormal findings: Secondary | ICD-10-CM | POA: Diagnosis present

## 2022-07-20 NOTE — Procedures (Signed)
  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 No concerns for hearing loss. No significant medical history impacting hearing.   Evaluation:  Otoscopy showed a clear view of the tympanic membranes, bilaterally Tympanometry results were consistent with normal middle ear function, bilaterally    Audiometric testing was completed using Conventional Audiometry techniques with supraural headphones. Normal pure tone air thresholds 250-8kHz, bilaterally. All thresholds at 15dB or below. [place] meets criteria for state employment.   Results:  The test results were reviewed with Darlys Gales and form filled out with audiometer serial number and provider license number. Normal hearing. Normal middle ear function.  Recommendations: 1.   Normal Hearing. [place] meets all criteria for state employment. No further testing outside OSHA screening required at this time.    17 minutes spent testing and counseling on results.   If you have any questions please feel free to contact me at [phone].  Alfonse Alpers Audiologist, Au.D., CCC-A 07/20/2022  9:21 AM  Cc: Patient, No Pcp Per

## 2022-08-22 ENCOUNTER — Other Ambulatory Visit: Payer: Self-pay

## 2022-08-22 ENCOUNTER — Emergency Department (HOSPITAL_COMMUNITY): Payer: Commercial Managed Care - HMO

## 2022-08-22 ENCOUNTER — Encounter (HOSPITAL_COMMUNITY): Payer: Self-pay | Admitting: Emergency Medicine

## 2022-08-22 ENCOUNTER — Emergency Department (HOSPITAL_COMMUNITY)
Admission: EM | Admit: 2022-08-22 | Discharge: 2022-08-22 | Disposition: A | Discharge status: Home / Self Care | Payer: Commercial Managed Care - HMO | Attending: Emergency Medicine | Admitting: Emergency Medicine

## 2022-08-22 DIAGNOSIS — R519 Headache, unspecified: Secondary | ICD-10-CM | POA: Diagnosis present

## 2022-08-22 LAB — COMPREHENSIVE METABOLIC PANEL
ALT: 20 U/L (ref 0–44)
AST: 21 U/L (ref 15–41)
Albumin: 4.4 g/dL (ref 3.5–5.0)
Alkaline Phosphatase: 49 U/L (ref 38–126)
Anion gap: 7 (ref 5–15)
BUN: 13 mg/dL (ref 6–20)
CO2: 24 mmol/L (ref 22–32)
Calcium: 8.9 mg/dL (ref 8.9–10.3)
Chloride: 105 mmol/L (ref 98–111)
Creatinine, Ser: 1.01 mg/dL (ref 0.61–1.24)
GFR, Estimated: >60 mL/min (ref >60)
Glucose, Bld: 93 mg/dL (ref 70–99)
Potassium: 4.1 mmol/L (ref 3.5–5.1)
Sodium: 136 mmol/L (ref 135–145)
Total Bilirubin: 0.9 mg/dL (ref 0.3–1.2)
Total Protein: 6.9 g/dL (ref 6.5–8.1)

## 2022-08-22 NOTE — ED Triage Notes (Addendum)
Pt reports posterior headache that started on Thursday. Pt also reporting bilateral eye twitching. Denies visual changes.

## 2022-08-22 NOTE — ED Provider Triage Note (Signed)
Emergency Medicine Provider Triage Evaluation Note  DESMEN SCHOFFSTALL , a 21 y.o. male  was evaluated in triage.  Pt complains of headache. Report pain to the back of head x 5 days, mild no fever, chills, cold sxs.  No injury, pain is minimal.  Did report some facial twitch a few days ago which was short lasting and resolved after eating banana.    Review of Systems  Positive: As above Negative: As above  Physical Exam  BP 123/74 (BP Location: Left Arm)   Pulse 61   Temp 98.2 F (36.8 C) (Oral)   Resp 18   SpO2 97%  Gen:   Awake, no distress   Resp:  Normal effort  MSK:   Moves extremities without difficulty  Other:    Medical Decision Making  Medically screening exam initiated at 2:42 PM.  Appropriate orders placed.  Eustace Quail was informed that the remainder of the evaluation will be completed by another provider, this initial triage assessment does not replace that evaluation, and the importance of remaining in the ED until their evaluation is complete.     Domenic Moras, PA-C 08/22/22 1444

## 2022-08-22 NOTE — Discharge Instructions (Signed)
You were seen in the ER today for headaches. Based on your symptoms, I suggest trying ibuprofen, Excedrin, or other over the counter headache therapies and see if symptoms improve. You should also see your optometrist and have your vision evaluated given the amount of screen time we discussed. Also follow up with your primary care provider if symptoms persist and do not improve with lifestyle changes or medications.  For screen time, try to create breaks into your workflow every 30 minutes or so to let your eyes relax and see if headaches improve.

## 2022-08-22 NOTE — ED Provider Notes (Signed)
[facility] DEPT Provider Note   CSN: [id number] Arrival date & time: 08/22/22  1216     History Chief Complaint  Patient presents with   Headache    Luis May is a 21 y.o. male.   Headache Associated symptoms: photophobia   Associated symptoms: no drainage, no ear pain, no eye pain and no sinus pressure    Patient presents to the ER with complaints of 5 days of posterior headaches. He reports associated bilateral eye twitching that improves when consuming a banana.  He denies sensitivity to light, sensitivity to sounds, nausea, vomiting, diarrhea, abdominal pain, chest pain, shortness of breath, or muscle twitching elsewhere. Has tried taking some Tylenol for headaches with minimal relief. Denies history of prior headaches like this, migraines, cluster headaches, or other neurological conditions.    Home Medications Prior to Admission medications   Medication Sig Start Date End Date Taking? Authorizing Provider  Adapalene (DIFFERIN) 0.3 % gel Apply a pea sized amount to the entire face QHS. 07/14/22   Ralene Bathe, MD  cephALEXin (KEFLEX) 500 MG capsule Take 1 capsule (500 mg total) by mouth 3 (three) times daily. Patient not taking: No sig reported 06/18/15   Jeannett Senior, PA-C  clindamycin-benzoyl peroxide (BENZACLIN WITH PUMP) gel Apply a thin coat to the entire face QAM. 07/14/22   Ralene Bathe, MD  naproxen (NAPROSYN) 500 MG tablet Take 1 tablet (500 mg total) by mouth 2 (two) times daily. Patient not taking: No sig reported 06/18/15   Jeannett Senior, PA-C  predniSONE (DELTASONE) 20 MG tablet 2 tabs po daily x 3 days 11/02/20   Palumbo, April, MD      Allergies    Strawberry (diagnostic)    Review of Systems   Review of Systems  HENT:  Negative for ear pain, facial swelling, postnasal drip, sinus pressure and sneezing.   Eyes:  Positive for photophobia. Negative for pain, discharge and redness.  Neurological:   Positive for headaches.  All other systems reviewed and are negative.   Physical Exam Updated Vital Signs BP 123/74 (BP Location: Left Arm)   Pulse 61   Temp 98.2 F (36.8 C) (Oral)   Resp 18   SpO2 97%  Physical Exam Vitals and nursing note reviewed.  Constitutional:      Appearance: He is well-developed and normal weight.  HENT:     Head: Normocephalic and atraumatic.  Eyes:     General: No visual field deficit. Cardiovascular:     Rate and Rhythm: Normal rate and regular rhythm.     Heart sounds: Normal heart sounds.  Pulmonary:     Effort: Pulmonary effort is normal.     Breath sounds: Normal breath sounds.  Skin:    General: Skin is warm and dry.  Neurological:     Mental Status: He is alert.     Cranial Nerves: No cranial nerve deficit or facial asymmetry.  Psychiatric:        Mood and Affect: Mood normal.        Speech: Speech normal.        Behavior: Behavior normal.     ED Results / Procedures / Treatments   Labs (all labs ordered are listed, but only abnormal results are displayed) Labs Reviewed  COMPREHENSIVE METABOLIC PANEL    EKG None  Radiology CT Head Wo Contrast  Result Date: 08/22/2022 CLINICAL DATA:  Headache, new onset (Age >= 74y) EXAM: CT HEAD WITHOUT CONTRAST TECHNIQUE: Contiguous axial images  were obtained from the base of the skull through the vertex without intravenous contrast. RADIATION DOSE REDUCTION: This exam was performed according to the departmental dose-optimization program which includes automated exposure control, adjustment of the mA and/or kV according to patient size and/or use of iterative reconstruction technique. COMPARISON:  None Available. FINDINGS: Brain: No acute intracranial abnormality. Specifically, no hemorrhage, hydrocephalus, mass lesion, acute infarction, or significant intracranial injury. Vascular: No hyperdense vessel or unexpected calcification. Skull: No acute calvarial abnormality. Sinuses/Orbits: No acute  findings Other: None IMPRESSION: Normal study. Electronically Signed   By: Rolm Baptise M.D.   On: 08/22/2022 19:42    Procedures Procedures   Medications Ordered in ED Medications - No data to display  ED Course/ Medical Decision Making/ A&P Clinical Course as of 08/22/22 2003  Mon Aug 22, 2022  1915 I-stat chem 8, ED (not at [facility] or [facility]) [OZ]  1932 Comprehensive metabolic panel [OZ]    Clinical Course User Index [OZ] Luvenia Heller, PA-C                           Medical Decision Making Amount and/or Complexity of Data Reviewed Labs: ordered. Decision-making details documented in ED Course.   This patient presents to the ED for concern of posterior headaches. Differential diagnosis includes but not limited to migraine headaches, cluster headaches, stroke, cerebral tumor, or viral URI.   Additional history obtained:  Additional history obtained from mother   Lab Tests:  I Ordered, and personally interpreted labs.  The pertinent results include:  normal CMP   Imaging Studies ordered:  I ordered imaging studies including CT head  I independently visualized and interpreted imaging which showed no intracranial abnormality, hemorrhage, hydrocephalus, mass lesion, acute infarction, or significant intracranial injury I agree with the radiologist interpretation   Problem List / ED Course:  Patient presented to the ER with complaints of headaches for 5 days. He states that the headaches come and go, but are not currently debilitating or keeping him from daily activities. He has tried taking some tylenol to manage without much success. Patient and his mother seemed more concerned about possibility that his headaches could be due to a tumor but no obvious focal findings during neurological exam or specific risk factors make this likely, but CT scan performed based on patient preference to get definitive answers. CT scan of head was negative for any abnormalities. Advised patient  that he should try over the counter options other Tylenol for headaches to see if he has improvement in symptoms. Also discussed reducing screen time based on current screen use and advised small breaks every 30 minutes if possible due to concerns about eye strain. Discussed seeing optometrist for vision check as he reported previously using glasses but hasn't for some time. Patient was agreeable to this plan and verbalized understanding return precautions.  Final Clinical Impression(s) / ED Diagnoses Final diagnoses:  Acute nonintractable headache, unspecified headache type    Rx / DC Orders ED Discharge Orders     None         Vladimir Creeks 08/22/22 2004    Fransico Meadow, MD 08/23/22 1147

## 2022-08-23 ENCOUNTER — Telehealth: Payer: Self-pay

## 2022-08-23 LAB — I-STAT CHEM 8, ED
BUN: 16 mg/dL (ref 6–20)
Calcium, Ion: 1.18 mmol/L (ref 1.15–1.40)
Chloride: 101 mmol/L (ref 98–111)
Creatinine, Ser: 1.1 mg/dL (ref 0.61–1.24)
Glucose, Bld: 93 mg/dL (ref 70–99)
HCT: 46 % (ref 39.0–52.0)
Hemoglobin: 15.6 g/dL (ref 13.0–17.0)
Potassium: 4.4 mmol/L (ref 3.5–5.1)
Sodium: 138 mmol/L (ref 135–145)
TCO2: 29 mmol/L (ref 22–32)

## 2022-08-23 NOTE — Patient Outreach (Signed)
Transition Care Management Follow-up Telephone Call Date of discharge and from where: 08/22/22 [facility] How have you been since you were released from the hospital? Okay, I still have a mild headache but I have not taken my Ib profen yet. Any questions or concerns? Yes  Items Reviewed: Did the pt receive and understand the discharge instructions provided? Yes  Medications obtained and verified? No  Other? Yes   Any new allergies since your discharge? No  Dietary orders reviewed? No Do you have support at home? Yes   Home Care and Equipment/Supplies: Were home health services ordered? not applicable If so, what is the name of the agency?   Has the agency set up a time to come to the patient's home? not applicable Were any new equipment or medical supplies ordered?  No What is the name of the medical supply agency?  Were you able to get the supplies/equipment? not applicable Do you have any questions related to the use of the equipment or supplies? No  Functional Questionnaire: (I = Independent and D = Dependent) ADLs: I  Bathing/Dressing- I  Meal Prep- I  Eating- I  Maintaining continence- I  Transferring/Ambulation- I  Managing Meds- I  Follow up appointments reviewed:  PCP Hospital f/u appt confirmed? No  Scheduled to see  on  @ . [facility] f/u appt confirmed?  Scheduled to see  on  @ . Are transportation arrangements needed?  If their condition worsens, is the pt aware to call PCP or go to the Emergency Dept.?  Was the patient provided with contact information for the PCP's office or ED?  Was to pt encouraged to call back with questions or concerns?  Mickel Fuchs, BSW, [employer] Managed Medicaid Team  [phone]

## 2023-07-27 ENCOUNTER — Ambulatory Visit: Payer: 59 | Admitting: Dermatology
